# Patient Record
Sex: Female | Born: 1960 | Race: Black or African American | Hispanic: No | Marital: Single | Smoking: Never smoker
Health system: Southern US, Community
[De-identification: ages and names within clinical notes are randomized; demographics above are authoritative.]

## PROBLEM LIST (undated history)

## (undated) DIAGNOSIS — I10 Essential (primary) hypertension: Secondary | ICD-10-CM

## (undated) DIAGNOSIS — E7800 Pure hypercholesterolemia, unspecified: Secondary | ICD-10-CM

## (undated) DIAGNOSIS — E119 Type 2 diabetes mellitus without complications: Secondary | ICD-10-CM

## (undated) HISTORY — PX: ABDOMINAL HYSTERECTOMY: SHX81

---

## 2020-01-28 ENCOUNTER — Encounter: Payer: Self-pay | Admitting: Emergency Medicine

## 2020-01-28 ENCOUNTER — Other Ambulatory Visit: Payer: Self-pay

## 2020-01-28 ENCOUNTER — Ambulatory Visit
Admission: EM | Admit: 2020-01-28 | Discharge: 2020-01-28 | Disposition: A | Discharge status: Home / Self Care | Payer: BC Managed Care – PPO

## 2020-01-28 ENCOUNTER — Ambulatory Visit (INDEPENDENT_AMBULATORY_CARE_PROVIDER_SITE_OTHER): Payer: BC Managed Care – PPO

## 2020-01-28 DIAGNOSIS — M25552 Pain in left hip: Secondary | ICD-10-CM

## 2020-01-28 DIAGNOSIS — M2560 Stiffness of unspecified joint, not elsewhere classified: Secondary | ICD-10-CM | POA: Diagnosis not present

## 2020-01-28 DIAGNOSIS — M79642 Pain in left hand: Secondary | ICD-10-CM | POA: Diagnosis not present

## 2020-01-28 DIAGNOSIS — R52 Pain, unspecified: Secondary | ICD-10-CM | POA: Diagnosis not present

## 2020-01-28 DIAGNOSIS — W19XXXA Unspecified fall, initial encounter: Secondary | ICD-10-CM

## 2020-01-28 DIAGNOSIS — R609 Edema, unspecified: Secondary | ICD-10-CM | POA: Diagnosis not present

## 2020-01-28 DIAGNOSIS — S63681A Other sprain of right thumb, initial encounter: Secondary | ICD-10-CM | POA: Diagnosis not present

## 2020-01-28 HISTORY — DX: Essential (primary) hypertension: I10

## 2020-01-28 HISTORY — DX: Pure hypercholesterolemia, unspecified: E78.00

## 2020-01-28 HISTORY — DX: Type 2 diabetes mellitus without complications: E11.9

## 2020-01-28 MED ORDER — CYCLOBENZAPRINE HCL 10 MG PO TABS: 10.0000 mg | ORAL_TABLET | Freq: Two times a day (BID) | ORAL | 0 refills | Status: AC | PRN

## 2020-01-28 MED ORDER — NABUMETONE 500 MG PO TABS: 500.0000 mg | ORAL_TABLET | Freq: Every day | ORAL | 0 refills | Status: AC

## 2020-01-28 NOTE — ED Provider Notes (Signed)
[facility]   [id number] 01/28/20 Arrival Time: 1008  JG:GEZMO PAIN  SUBJECTIVE: History from: patient. Robert Sunga is a 59 y.o. female complains of left hip and left hand and thumb pain that began 01/17/20. Reports that she fell on a concrete sidewalk and that she landed on her L hip and then caught herself with her L hand. Has been taking OTC pain relievers without benefit. Has tried OTC medications without relief. Symptoms are made worse with activity. Denies similar symptoms in the past.  Denies fever, chills, ecchymosis, effusion, weakness, numbness and tingling, saddle paresthesias, loss of bowel or bladder function.      ROS: As per HPI.  All other pertinent ROS negative.     Past Medical History:  Diagnosis Date  . Diabetes mellitus without complication (HCC)   . Hypercholesterolemia   . Hypertension    Past Surgical History:  Procedure Laterality Date  . ABDOMINAL HYSTERECTOMY     No Known Allergies No current facility-administered medications on file prior to encounter.   Current Outpatient Medications on File Prior to Encounter  Medication Sig Dispense Refill  . atorvastatin (LIPITOR) 40 MG tablet Take by mouth.    . benazepril (LOTENSIN) 20 MG tablet TAKE 1 TABLET (20 MG TOTAL) BY MOUTH NIGHTLY.    Marland Kitchen Cholecalciferol 50 MCG (2000 UT) TABS Take by mouth.    . estradiol (ESTRACE) 1 MG tablet Take 1 tablet by mouth daily.    Marland Kitchen glipiZIDE (GLUCOTROL) 5 MG tablet Take 1 tablet by mouth daily.    Marland Kitchen omeprazole (PRILOSEC) 40 MG capsule Take 1 tablet by mouth daily.     Social History   Socioeconomic History  . Marital status: Single    Spouse name: Not on file  . Number of children: Not on file  . Years of education: Not on file  . Highest education level: Not on file  Occupational History  . Not on file  Tobacco Use  . Smoking status: Never Smoker  . Smokeless tobacco: Never Used  Vaping Use  . Vaping Use: Never used  Substance and Sexual Activity  .  Alcohol use: Yes  . Drug use: Never  . Sexual activity: Not on file  Other Topics Concern  . Not on file  Social History Narrative  . Not on file   Social Determinants of Health   Financial Resource Strain:   . Difficulty of Paying Living Expenses: Not on file  Food Insecurity:   . Worried About Programme researcher, broadcasting/film/video in the Last Year: Not on file  . Ran Out of Food in the Last Year: Not on file  Transportation Needs:   . Lack of Transportation (Medical): Not on file  . Lack of Transportation (Non-Medical): Not on file  Physical Activity:   . Days of Exercise per Week: Not on file  . Minutes of Exercise per Session: Not on file  Stress:   . Feeling of Stress : Not on file  Social Connections:   . Frequency of Communication with Friends and Family: Not on file  . Frequency of Social Gatherings with Friends and Family: Not on file  . Attends Religious Services: Not on file  . Active Member of Clubs or Organizations: Not on file  . Attends Banker Meetings: Not on file  . Marital Status: Not on file  Intimate Partner Violence:   . Fear of Current or Ex-Partner: Not on file  . Emotionally Abused: Not on file  . Physically Abused:  Not on file  . Sexually Abused: Not on file   History reviewed. No pertinent family history.  OBJECTIVE:  Vitals:   01/28/20 1032 01/28/20 1035  BP:  (!) 152/84  Pulse:  75  Resp:  18  Temp:  98 F (36.7 C)  TempSrc:  Oral  SpO2:  100%  Weight: 155 lb (70.3 kg)   Height: 5\' 2"  (1.575 m)     General appearance: ALERT; in no acute distress.  Head: NCAT Lungs: Normal respiratory effort CV: pulses 2+ bilaterally. Cap refill < 2 seconds Musculoskeletal:  Exam: Skin warm, dry, clear and intact. Palmar surface of L hand and thumb mildly erythematous and swollen, TTP ROM: limited ROM active and passive to L hand and L hip Skin: warm and dry Neurologic: Ambulates without difficulty; Sensation intact about the upper/ lower  extremities Psychological: alert and cooperative; normal mood and affect  DIAGNOSTIC STUDIES:  DG Hand Complete Left  Result Date: 01/28/2020 CLINICAL DATA:  Fall.  Pain, swelling, tenderness. EXAM: LEFT HAND - COMPLETE 3+ VIEW COMPARISON:  No prior. FINDINGS: There is no evidence of acute fracture or dislocation. Tiny bony density noted along the distal aspect of the left fourth metacarpal, possibly a tiny exostosis or site of prior injury. Soft tissues are unremarkable. IMPRESSION: No acute abnormality identified. Electronically Signed   By: 04-23-1971  Register   On: 01/28/2020 11:20   DG Hip Unilat With Pelvis 2-3 Views Left  Result Date: 01/28/2020 CLINICAL DATA:  Fall, left hip pain EXAM: DG HIP (WITH OR WITHOUT PELVIS) 2-3V LEFT COMPARISON:  None. FINDINGS: No fracture or dislocation is seen. Bilateral hip joint spaces are preserved. Visualized bony pelvis appears intact. Mild degenerative changes of the lower lumbar spine. IMPRESSION: Negative. Electronically Signed   By: 13/04/2019 M.D.   On: 01/28/2020 11:17     ASSESSMENT & PLAN:  1. Other sprain of right thumb, initial encounter   2. Pain   3. Fall   4. Limited joint range of motion   5. Left hip pain       Meds ordered this encounter  Medications  . nabumetone (RELAFEN) 500 MG tablet    Sig: Take 1 tablet (500 mg total) by mouth daily.    Dispense:  30 tablet    Refill:  0    Order Specific Question:   Supervising Provider    Answer:   13/04/2019 Merrilee Jansky  . cyclobenzaprine (FLEXERIL) 10 MG tablet    Sig: Take 1 tablet (10 mg total) by mouth 2 (two) times daily as needed for muscle spasms.    Dispense:  20 tablet    Refill:  0    Order Specific Question:   Supervising Provider    Answer:   [id number] Merrilee Jansky   L hand xray negative L hip xray negative Continue conservative management of rest, ice, and gentle stretches Take nabumetone as needed for pain relief (may cause abdominal discomfort,  ulcers, and GI bleeds avoid taking with other NSAIDs) Take cyclobenzaprine at nighttime for symptomatic relief. Avoid driving or operating heavy machinery while using medication. Follow up with ortho if symptoms persist Return or go to the ER if you have any new or worsening symptoms (fever, chills, chest pain, abdominal pain, changes in bowel or bladder habits, pain radiating into lower legs)   Reviewed expectations re: course of current medical issues. Questions answered. Outlined signs and symptoms indicating need for more acute intervention. Patient verbalized understanding. After Visit Summary given.  Moshe Cipro, NP 01/28/20 1616

## 2020-01-28 NOTE — Discharge Instructions (Signed)
Your xrays are negative for any fractures or misalignments  I think that you have sprained your thumb  I cannot rule out a soft tissue injury in the office today   I have sent in nabumetone for you to take once daily for pain and inflammation. Do not take Aleve or ibuprofen with this medication  I have sent in flexeril for you to take twice a day for muscle spasms as needed. This medication can make you very sleepy. Do not drive or operate heavy machinery while taking this medication.  If symptoms are persisting, follow up with orthopedics. I have attached information for [facility] for you  Follow up with this office or with primary care if symptoms are persisting.  Follow up in the ER for high fever, trouble swallowing, trouble breathing, other concerning symptoms.

## 2020-01-28 NOTE — ED Triage Notes (Signed)
Patient states she fell on 10/22. She is c/o left hand/thumb pain and left hip pain/

## 2021-02-11 ENCOUNTER — Encounter: Payer: Self-pay | Admitting: Licensed Clinical Social Worker

## 2021-02-11 ENCOUNTER — Ambulatory Visit
Admission: EM | Admit: 2021-02-11 | Discharge: 2021-02-11 | Disposition: A | Discharge status: Home / Self Care | Payer: BC Managed Care – PPO | Attending: Emergency Medicine | Admitting: Emergency Medicine

## 2021-02-11 ENCOUNTER — Other Ambulatory Visit: Payer: Self-pay

## 2021-02-11 DIAGNOSIS — M5416 Radiculopathy, lumbar region: Secondary | ICD-10-CM | POA: Diagnosis not present

## 2021-02-11 MED ORDER — MELOXICAM 15 MG PO TABS: 15.0000 mg | ORAL_TABLET | Freq: Every day | ORAL | 0 refills | Status: AC

## 2021-02-11 MED ORDER — METHYLPREDNISOLONE SODIUM SUCC 40 MG IJ SOLR
60.0000 mg | Freq: Once | INTRAMUSCULAR | Status: AC
  Administered 2021-02-11: 10:00:00 60 mg via INTRAMUSCULAR

## 2021-02-11 NOTE — Discharge Instructions (Addendum)
Your pain is most likely caused by irritation to the muscles or ligaments.   Today you are being given a steroid injection here in office to help reduce irritation and inflammation, will avoid an oral steroid course due to your diabetes  You may begin taking meloxicam every morning for the next 7 days starting tomorrow to further help reduce irritation and inflammation  If symptoms continue to persist please reach out to your orthopedic specialist for further evaluation  You may use heating pad in 15 minute intervals as needed for additional comfort  Begin stretching affected area daily for 10 minutes as tolerated to further loosen muscles   When lying down place pillow underneath and between knees for support  Can try sleeping without pillow on firm mattress   Practice good posture: head back, shoulders back, chest forward, pelvis back and weight distributed evenly on both legs

## 2021-02-11 NOTE — ED Triage Notes (Signed)
Pt c/o lower back pain, left hip pain, worsened yesterday after yoga. Sxs have been chronic, but worsened in the past few days.

## 2021-02-11 NOTE — ED Provider Notes (Signed)
[facility]    CSN: [id number] Arrival date & time: 02/11/21  5638      History   Chief Complaint Chief Complaint  Patient presents with   Hip Pain   Back Pain    HPI Barbara Levine is a 60 y.o. female.   Patient presents with left lower back pain radiating into hip and buttocks for 2 weeks.  Range of motion intact but elicits pain.  Denies numbness, tingling, precipitating event prior injury or trauma, urinary or bowel changes.  History of arthritis, patient endorses that she usually has arthritic flareups during the winter.  Has attempted use of Relefan with no relief.  Has been using heating pad which is somewhat helpful.  Attempted to get appointment with primary doctor but was unable to be seen until December.  History of diabetes, hypertension, hyperlipidemia.  Past Medical History:  Diagnosis Date   Diabetes mellitus without complication (HCC)    Hypercholesterolemia    Hypertension     There are no problems to display for this patient.   Past Surgical History:  Procedure Laterality Date   ABDOMINAL HYSTERECTOMY      OB History   No obstetric history on file.      Home Medications    Prior to Admission medications   Medication Sig Start Date End Date Taking? Authorizing Provider  atorvastatin (LIPITOR) 40 MG tablet Take by mouth. 01/18/16  Yes [provider]  benazepril (LOTENSIN) 20 MG tablet TAKE 1 TABLET (20 MG TOTAL) BY MOUTH NIGHTLY. 07/25/16  Yes [provider]  Cholecalciferol 50 MCG (2000 UT) TABS Take by mouth.   Yes [provider]  cyclobenzaprine (FLEXERIL) 10 MG tablet Take 1 tablet (10 mg total) by mouth 2 (two) times daily as needed for muscle spasms. 01/28/20  Yes Moshe Cipro, NP  estradiol (ESTRACE) 1 MG tablet Take 1 tablet by mouth daily. 09/05/18  Yes [provider]  glipiZIDE (GLUCOTROL) 5 MG tablet Take 1 tablet by mouth daily. 01/01/20  Yes [provider]  nabumetone  (RELAFEN) 500 MG tablet Take 1 tablet (500 mg total) by mouth daily. 01/28/20  Yes Moshe Cipro, NP  omeprazole (PRILOSEC) 40 MG capsule Take 1 tablet by mouth daily. 12/27/16  Yes [provider]    Family History History reviewed. No pertinent family history.  Social History Social History   Tobacco Use   Smoking status: Never   Smokeless tobacco: Never  Vaping Use   Vaping Use: Never used  Substance Use Topics   Alcohol use: Yes   Drug use: Never     Allergies   Patient has no known allergies.   Review of Systems Review of Systems  Constitutional: Negative.   Respiratory: Negative.    Cardiovascular: Negative.   Gastrointestinal: Negative.   Genitourinary: Negative.   Musculoskeletal:  Positive for back pain. Negative for arthralgias, gait problem, joint swelling, myalgias, neck pain and neck stiffness.  Skin: Negative.   Neurological: Negative.     Physical Exam Triage Vital Signs ED Triage Vitals  Enc Vitals Group     BP 02/11/21 1000 (!) 149/85     Pulse Rate 02/11/21 1000 72     Resp 02/11/21 1000 16     Temp 02/11/21 1000 98 F (36.7 C)     Temp Source 02/11/21 1000 Oral     SpO2 02/11/21 1000 99 %     Weight 02/11/21 0958 156 lb (70.8 kg)     Height 02/11/21 0958 5'  2" (1.575 m)     Head Circumference --      Peak Flow --      Pain Score 02/11/21 0958 7     Pain Loc --      Pain Edu? --      Excl. in GC? --    No data found.  Updated Vital Signs BP (!) 149/85 (BP Location: Left Arm)   Pulse 72   Temp 98 F (36.7 C) (Oral)   Resp 16   Ht 5\' 2"  (1.575 m)   Wt 156 lb (70.8 kg)   SpO2 99%   BMI 28.53 kg/m   Visual Acuity Right Eye Distance:   Left Eye Distance:   Bilateral Distance:    Right Eye Near:   Left Eye Near:    Bilateral Near:     Physical Exam Constitutional:      Appearance: Normal appearance. She is normal weight.  HENT:     Head: Normocephalic.  Eyes:     Extraocular Movements: Extraocular movements  intact.  Pulmonary:     Effort: Pulmonary effort is normal.  Musculoskeletal:     Comments: Diffuse tenderness along the left lower latissimus dorsi extending into the left buttocks, range of motion of the back intact, range of motion of left hip intact, no point tenderness noted, no spasms, crepitus, deformity or swelling noted  Skin:    General: Skin is dry.  Neurological:     Mental Status: She is alert and oriented to person, place, and time. Mental status is at baseline.  Psychiatric:        Behavior: Behavior normal.     UC Treatments / Results  Labs (all labs ordered are listed, but only abnormal results are displayed) Labs Reviewed - No data to display  EKG   Radiology No results found.  Procedures Procedures (including critical care time)  Medications Ordered in UC Medications - No data to display  Initial Impression / Assessment and Plan / UC Course  I have reviewed the triage vital signs and the nursing notes.  Pertinent labs & imaging results that were available during my care of the patient were reviewed by me and considered in my medical decision making (see chart for details).  Lumbar back pain with radiculopathy affecting left lower extremity  1.  Methylprednisolone 60 mg IM now, will avoid oral steroid course due to history of diabetes 2.  Meloxicam 15 mg daily for 7 days then as needed 3.  Advised patient to begin use of muscle relaxer as needed for bedtime, already has medication at home 4.  Advised pillows for support, daily stretching and continue yoga classes as tolerated, heat in 15-minute intervals as needed 5.  Recommended follow-up with orthopedic specialist as needed, patient already is established with Dr. Final Clinical Impressions(s) / UC Diagnoses   Final diagnoses:  None   Discharge Instructions   None    ED Prescriptions   None    PDMP not reviewed this encounter.   , NP 02/11/21 1049

## 2022-06-20 IMAGING — CR DG HIP (WITH OR WITHOUT PELVIS) 2-3V*L*
3 series · 3 of 3 positions shown · non-contrast
Comparison: None.

CLINICAL DATA: Fall, left hip pain

EXAM:
DG HIP (WITH OR WITHOUT PELVIS) 2-3V LEFT

[pelvis ap]
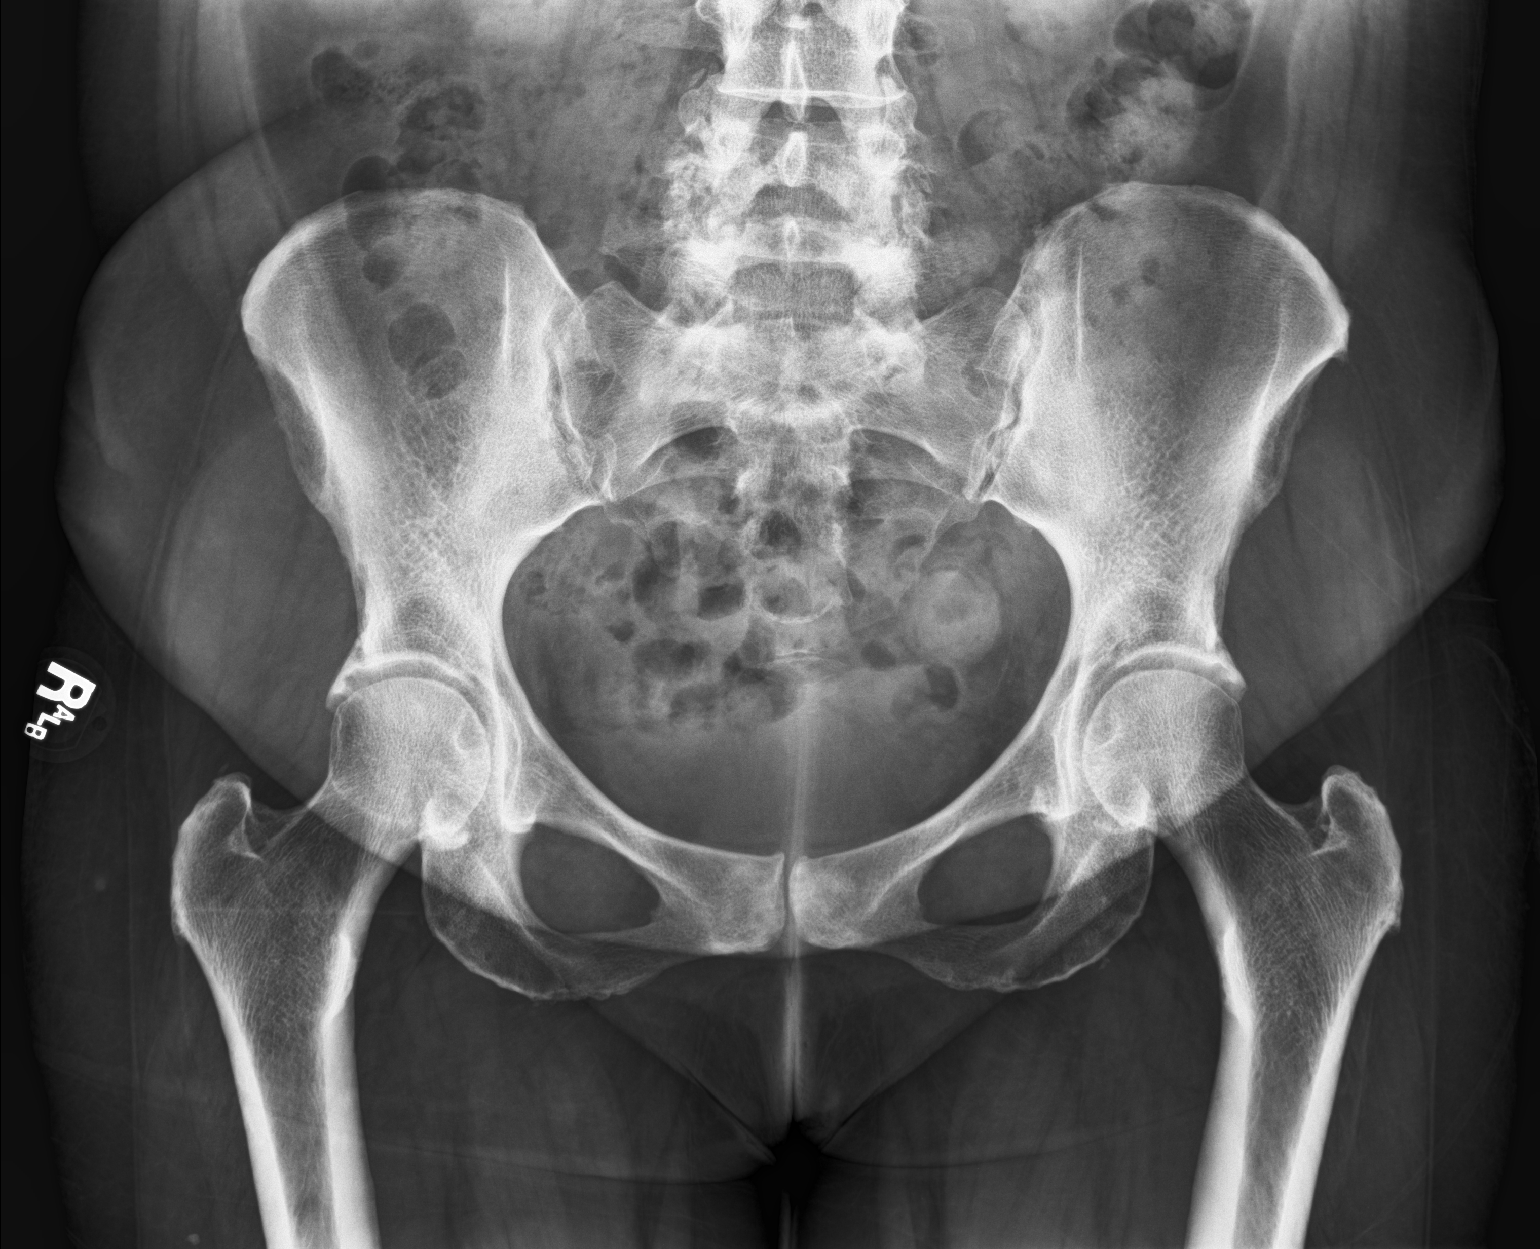

[hip ap]
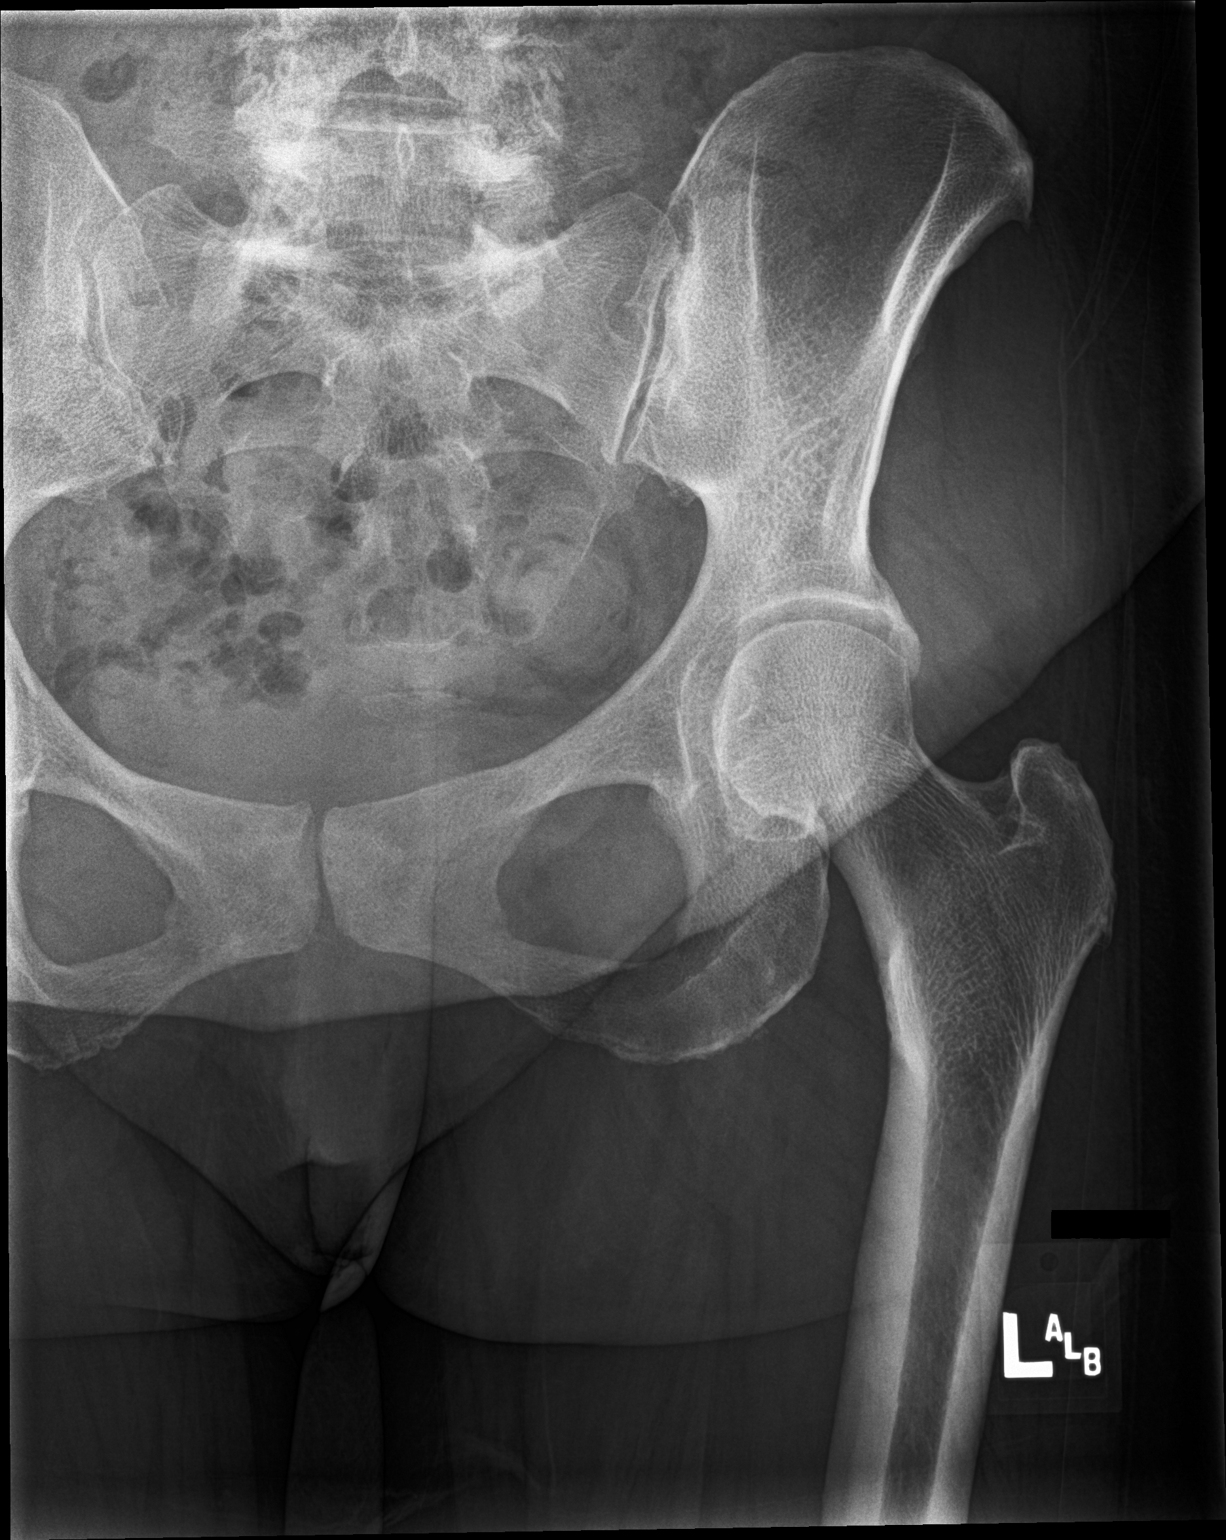

[hip lat]
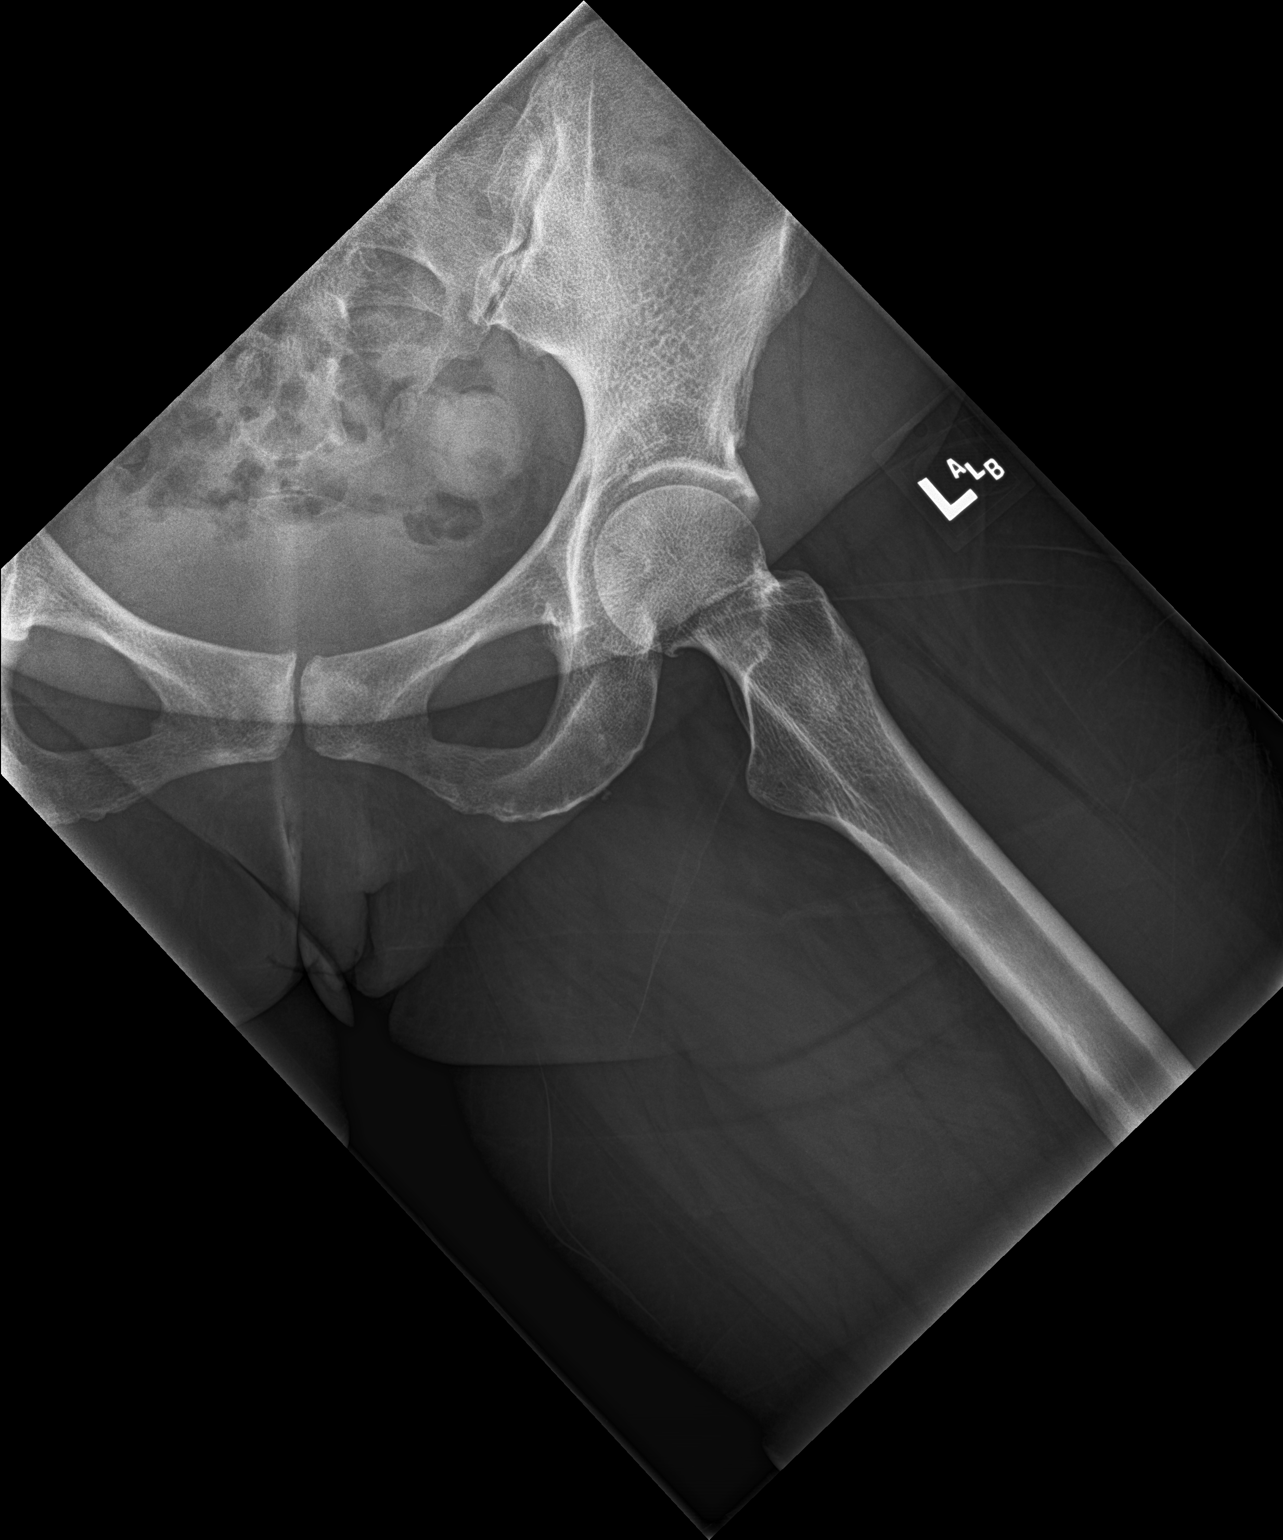

[3 of 3 positions shown; findings below may reference images not displayed]

FINDINGS: No fracture or dislocation is seen.

Bilateral hip joint spaces are preserved.

Visualized bony pelvis appears intact.

Mild degenerative changes of the lower lumbar spine.
IMPRESSION: Negative.
